# Patient Record
Sex: Female | Born: 1980
Health system: Southern US, Community
[De-identification: ages and names within clinical notes are randomized; demographics above are authoritative.]

## PROBLEM LIST (undated history)

## (undated) DIAGNOSIS — D48119 Desmoid tumor of unspecified site: Secondary | ICD-10-CM

## (undated) DIAGNOSIS — D689 Coagulation defect, unspecified: Secondary | ICD-10-CM

## (undated) DIAGNOSIS — D6851 Activated protein C resistance: Secondary | ICD-10-CM

## (undated) DIAGNOSIS — D481 Neoplasm of uncertain behavior of connective and other soft tissue: Secondary | ICD-10-CM

## (undated) HISTORY — DX: Coagulation defect, unspecified: D68.9

## (undated) HISTORY — PX: OTHER SURGICAL HISTORY: SHX169

---

## 2001-12-08 ENCOUNTER — Other Ambulatory Visit: Admission: RE | Admit: 2001-12-08 | Discharge: 2001-12-08 | Payer: Self-pay | Admitting: Obstetrics and Gynecology

## 2004-06-25 ENCOUNTER — Other Ambulatory Visit: Admission: RE | Admit: 2004-06-25 | Discharge: 2004-06-25 | Payer: Self-pay | Admitting: Obstetrics and Gynecology

## 2005-09-24 ENCOUNTER — Other Ambulatory Visit: Admission: RE | Admit: 2005-09-24 | Discharge: 2005-09-24 | Payer: Self-pay | Admitting: Obstetrics and Gynecology

## 2006-11-24 ENCOUNTER — Inpatient Hospital Stay (HOSPITAL_COMMUNITY): Admission: AD | Admit: 2006-11-24 | Discharge: 2006-11-28 | Payer: Self-pay | Admitting: Obstetrics and Gynecology

## 2006-12-18 ENCOUNTER — Ambulatory Visit (HOSPITAL_COMMUNITY): Admission: RE | Admit: 2006-12-18 | Discharge: 2006-12-19 | Payer: Self-pay | Admitting: Obstetrics and Gynecology

## 2007-05-02 ENCOUNTER — Encounter: Admission: RE | Admit: 2007-05-02 | Discharge: 2007-05-02 | Payer: Self-pay | Admitting: Obstetrics and Gynecology

## 2007-05-04 ENCOUNTER — Encounter: Admission: RE | Admit: 2007-05-04 | Discharge: 2007-05-04 | Payer: Self-pay | Admitting: Obstetrics and Gynecology

## 2008-01-16 ENCOUNTER — Encounter: Admission: RE | Admit: 2008-01-16 | Discharge: 2008-01-16 | Payer: Self-pay | Admitting: Obstetrics and Gynecology

## 2009-10-27 ENCOUNTER — Emergency Department (HOSPITAL_COMMUNITY): Admission: EM | Admit: 2009-10-27 | Discharge: 2009-10-27 | Payer: Self-pay | Admitting: Emergency Medicine

## 2010-09-30 NOTE — H&P (Signed)
NAME:  [name] [name], [name] [name]                   ACCOUNT NO.:  [id number]   MEDICAL RECORD NO.:  [id number]          PATIENT TYPE:  AMB   LOCATION:  SDC                           FACILITY:  [facility]   PHYSICIAN:  Janine Limbo, M.D.DATE OF BIRTH:  [birth year]   DATE OF ADMISSION:  12/18/2006  DATE OF DISCHARGE:                              HISTORY & PHYSICAL   HISTORY OF PRESENT ILLNESS:  The patient is a 30 year old female, now  para 1-0-0-1, who presents for evacuation of a right vulvar hematoma.  The patient had a vaginal delivery on 11/25/2006.  She was noted to  develop a right vulvar hematoma during the pushing process of labor.  After delivery, the hematoma continued to be present, although its size  was thought to be stable.  The patient reports that since she has been  home, the right vulvar area remains very uncomfortable and at this point  she wishes to have evacuation in hopes of making her recovery quicker.   OBSTETRICAL HISTORY:  The patient had a vaginal delivery of a term female  infant on 11/25/2006.   PAST MEDICAL HISTORY:  The patient denies hypertension and diabetes.  She did have her wisdom teeth removed in 2004 and in 2005.   DRUG ALLERGIES:  THE PATIENT REPORTS THAT MORPHINE CAUSES HER TO ITCH,  BUT SHE WAS TOLD THAT THIS WAS NOT A TRUE DRUG ALLERGY.  SHE DENIES  ALLERGIES TO LATEX AND TO BETADINE.   REVIEW OF SYSTEMS:  Please see history of present illness.   FAMILY HISTORY:  Family history is noncontributory.   SOCIAL HISTORY:  The patient is married and she works as a Nurse, mental health.  She denies cigarette use, alcohol use, and recreational drug  use.   PHYSICAL EXAMINATION:  VITAL SIGNS:  Height is 5 feet 2 inches, weight  is 166 pounds.  HEENT:  Within normal limits.  CHEST:  The chest is clear.  HEART:  Regular rate and rhythm.  BREASTS:  Her breasts are without masses.  ABDOMEN:  Her abdomen is nontender.  EXTREMITIES:  The extremities are grossly normal.  NEUROLOGIC:  Examination is grossly normal.  PELVIC EXAMINATION:  External genitalia, there is a large, mature  hematoma of the right labia.  The vagina is within normal limits.  The  cervix is within normal limits.  The uterus is upper limits of normal  size.  No adnexal masses can be appreciated.   ASSESSMENT:  Right vulvar hematoma.   PLAN:  The patient will have evacuation of her right vulvar hematoma.  We talked about the options for management, which include observation  and evacuation.  She elects to proceed with evacuation at this time.      Janine Limbo, M.D.  Electronically Signed     AVS/MEDQ  D:  12/16/2006  T:  12/16/2006  Job:  [id number]

## 2010-09-30 NOTE — Op Note (Signed)
NAME:  [name], [name]                   ACCOUNT NO.:  [id number]   MEDICAL RECORD NO.:  [id number]          PATIENT TYPE:  AMB   LOCATION:  SDC                           [facility]   PHYSICIAN:  Janine Limbo, M.D.DATE OF BIRTH:  11-17-1980   DATE OF PROCEDURE:  12/18/2006  DATE OF DISCHARGE:                               OPERATIVE REPORT   PREOPERATIVE DIAGNOSIS:  Right vulvar hematoma.   POSTOPERATIVE DIAGNOSIS:  Right vulvar hematoma.   PROCEDURE:  Evacuation of right vulvar hematoma.   SURGEON:  Janine Limbo, M.D.   FIRST ASSISTANT:  None.   ANESTHESIA:  General.   DISPOSITION:  [name] [name] is a 30 year old female, para 1-0-0-1, who had a  vaginal delivery three weeks ago.  She had a right vulvar hematoma to  develop during the pushing stage of her labor.  The area continues to be  very uncomfortable at this time.  There is no evidence of infection.  The discomfort has progressed to the point that it is difficult for her  to perform her normal daily functions.  The patient understands the  indications for her surgical procedure and she accepts the risks of, but  not limited to, anesthetic complications, bleeding, infection, and  possible damage to surrounding organs.   FINDINGS:  The patient was noted to have a 12 x 14 cm right vulvar  hematoma.  There was no evidence of infection.  Old blood was drained  from the cavity.  Again, there was no evidence of purulence.  The vagina  appeared normal, otherwise.  The lacerations from her delivery were well  healed.   PROCEDURE IN DETAIL:  The patient was taken to the operating room where  a general anesthetic was given.  The patient's lower abdomen, perineum,  and vagina were prepped with multiple layers of Betadine.  The bladder  was drained of urine.  Examination under anesthesia was performed.  The  patient was sterilely draped.  The inner portion of the right labia was  then injected with 10 mL of 0.5% Marcaine  with epinephrine.  An incision  was made on the medial aspect of the right labia minor and 80 mL of old  blood was drained from the cavity without difficulty.  There was minimal  bleeding from the incision.  The incision was stitched open using a  running suture of 0 Vicryl.  The cavity was then vigorously irrigated.  Hemostasis was noted be adequate throughout.  We felt that we were ready  to end our procedure.  The patient was awakened from her anesthetic  without difficulty and taken to the recovery room in stable condition.  The patient tolerated her procedure well.  The estimated blood loss for  the procedure was 2 mL of new blood, although old blood was drained from  the hematoma cavity.  The patient was given a gram of Ancef  preoperatively.   FOLLOW UP INSTRUCTIONS:  The patient will take ibuprofen 600 mg every 6  hours as needed for mild to moderate pain.  She will take Percocet  5/325, 1-2 tablets every 4-6 hours as needed for severe pain.  She will  sit in a hot tub of water at least once each day until the area is  completely healed.  She will return to see Dr. Stefano [name] in 2-3 weeks for  follow up examination.  She will call for questions or concerns.  She  was given a prescription for Keflex  and she will take 500 mg three times each day for 7 days.  She was given  a copy of the postoperative instruction sheet as prepared by the [facility] for patients who have undergone a dilatation and  curettage that was modified for evacuation of a hematoma.      Janine Limbo, M.D.  Electronically Signed     AVS/MEDQ  D:  12/18/2006  T:  12/18/2006  Job:  [id number]

## 2010-09-30 NOTE — Op Note (Signed)
NAME:  [name], [name]                   ACCOUNT NO.:  [id number]   MEDICAL RECORD NO.:  [id number]          PATIENT TYPE:  AMB   LOCATION:  SDC                           [facility]   PHYSICIAN:  Janine Limbo, M.D.DATE OF BIRTH:  [birth year]   DATE OF PROCEDURE:  12/18/2006  DATE OF DISCHARGE:                               OPERATIVE REPORT   PREOPERATIVE DIAGNOSIS:  Recurrent right vulvar hematoma.   POSTOPERATIVE DIAGNOSIS:  Recurrent right vulvar hematoma.   PROCEDURE:  Re-exploration of right vulvar hematoma with packing.   SURGEON:  Janine Limbo, M.D.   FIRST ASSISTANT:  None.   ANESTHESIA:  Spinal.   DISPOSITION:  [name] [name] is a 30 year old female, para 1-0-0-1, who had a  vaginal delivery on November 25, 2006.  She developed a right vulvar  hematoma prior to delivery of the infant.  The area continued to be very  uncomfortable and she decided to have the area evacuated.  The hematoma  was evacuated this morning and the patient did very well.  Old blood was  noted.  There was no active bleeding.  The hematoma was irrigated and  the patient was taken to the recovery room after the opening of the  hematoma was stitched open.  The patient did well in the recovery room.  She was allowed to eat and was allowed to dress.  Her IV was removed.  The patient went to the restroom prior to discharge and began having  bleeding and then very quickly reaccumulated the right vulvar hematoma.  We were able to evacuate the hematoma in the recovery room, but because  of our concern for how rapidly this occurred, the decision was made to  return to the operating room for re-exploration.  The patient understood  the indications for the re-exploration and she accepted the risks of,  but not limited to, anesthetic complications, bleeding, infection, and  possible damage to surrounding organs.   FINDINGS:  Once we were in the operating room for this second  exploration, the patient was not  noted to have any active bleeding.  The  hematoma had not re-accumulated.  We did explore the area, however, and  with exploration there was bleeding that occurred from the bed of the  old hematoma.  There was no evidence of infection.   PROCEDURE IN DETAIL:  The patient was taken to the operating room for  the second time and this time a spinal anesthetic was given because the  patient had eaten.  The patient's lower abdomen, perineum, and vagina  were prepped with multiple layers of Betadine.  The bladder was drained  of urine.  Examination under anesthesia was performed.  The patient was  sterilely draped.  The incision at the right labia was then extended  approximately 1-2 cm and we explored the bed of the old hematoma.  At  this point, she did have some bleeding and hemostasis was achieved using  the bipolar cautery and 0 Vicryl.  There was no evidence of any apparent  vessels and there was no active pumping  blood vessels.  The hematoma bed  was then packed using 1/2 inch and 1 inch gauze.  The patient was  observed and hemostasis was adequate.  The hematoma bed was injected  with 10 mL of 0.5% Marcaine with epinephrine.  The patient was then  taken to the recovery room in stable condition.  The estimated blood  loss was 20 mL for this portion of the procedure.  She tolerated her  procedure well.   FOLLOW UP INSTRUCTIONS:  The the patient will be observed overnight in  the hospital.  We will obtain a DIC panel (recommendation of Dr. Donnie Coffin  from hematology).  We will plan to remove the packing either on December 19, 2006, or leave the packing in place for a couple of days.      Janine Limbo, M.D.  Electronically Signed     AVS/MEDQ  D:  12/18/2006  T:  12/18/2006  Job:  [id number]

## 2010-09-30 NOTE — Discharge Summary (Signed)
NAME:  [name] [name], [name] [name]                   ACCOUNT NO.:  [id number]   MEDICAL RECORD NO.:  [id number]          PATIENT TYPE:  INP   LOCATION:  9317                          [facility]ILITY:  [facility]   PHYSICIAN:  Janine Limbo, M.D.DATE OF BIRTH:  1980/07/07   DATE OF ADMISSION:  12/18/2006  DATE OF DISCHARGE:  12/19/2006                               DISCHARGE SUMMARY   ADMISSION DIAGNOSIS:  Recurrent right vulvar hematoma.   DISCHARGE DIAGNOSIS:  Recurrent right vulvar hematoma.   PROCEDURE:  This admission:  December 18, 2006.  Evacuation of vulvar  hematoma and then December 18, 2006, re-exploration of vulvar hematoma with  packing.   HISTORY OF PRESENT ILLNESS:  [name] [name] is a 30 year old female who had a  vaginal delivery on November 25, 2006.  She developed a right vulvar  hematoma, while she was pushing during her labor.  The hematoma  persisted and became very uncomfortable.  The patient decided to proceed  with evacuation of the hematoma.  Please see dictated history and  physical exam, for details.   ADMISSION PHYSICAL EXAM:  The patient was noted to have a large, tender  but matured hematoma of the right vulva.  There is no evidence of  induration and no evidence of infection.   HOSPITAL COURSE:  On December 18, 2006, the patient was taken to the  operating room, where the right vulvar hematoma was evacuated.  Old  blood was noted.  The area was irrigated and was noted to be hemostatic.  The patient was sent to the recovery room in stable condition.  In the  recovery room, she recovered well.  She was allowed to eat and was then  allowed to get dressed.  She went to the bathroom to void prior to  discharge and began having bleeding from the right vulvar space.  The  hematoma re-accumulated.  The clot was then evacuated in the recovery  room.  The patient was taken back to the operating room for exploration  of the space.  With re-exploration, no distinct bleeding vessels were  noted.  There  was some generalized oozing from the hematoma cavity.  Hemostasis was achieved using bipolar cautery and figure-of-eight  sutures.  The inner cavity was noted to be friable, however.  The cavity  was packed with gauze.  The patient was taken to the recovery room in  stable condition.  She was given the option of discharge on December 18, 2006 for observation overnight with adequate pain management.  The  patient elected to be observed overnight.  On December 19, 2006, the  patient was able to void, and she tolerated a regular diet.  She was  able to ambulate with the packing in place, without severe discomfort.  She remained afebrile throughout her stay.  Bleeding was minimal.  The  patient's post operative hemoglobin was 13.  Her postoperative white  blood cell count was 7,300.  Her DIC panel was negative.  We felt the  patient was ready for discharge.   DISCHARGE INSTRUCTIONS:  The patient will return to see  Dr. Stefano Gaul in  2 to 3 days.  She will call for heavy bleeding or for problems.  She was  given a prescription for ibuprofen, and she will take 600 mg every 6  hours as needed for mild to moderate pain.  She will take Vicodin 1 to 2  tablets every 4 hours, as needed for severe pain.  She  will take Keflex 500 mg three times each day for 7 days.  She was given  a copy of the postoperative instruction sheet, as prepared by the  [facility] for patients who have undergone a  dilatation and curettage, but then was modified for hematoma evacuation.      Janine Limbo, M.D.  Electronically Signed     AVS/MEDQ  D:  12/19/2006  T:  12/19/2006  Job:  [id number]

## 2010-09-30 NOTE — H&P (Signed)
NAME:  Pann, Rubie                   ACCOUNT NO.:  [id number]   MEDICAL RECORD NO.:  [id number]          PATIENT TYPE:  MAT   LOCATION:  MATC                          [facility]ILITY:  [facility]   PHYSICIAN:  Naima A. Dillard, M.D. DATE OF BIRTH:  Feb 27, 1981   DATE OF ADMISSION:  11/24/2006  DATE OF DISCHARGE:                              HISTORY & PHYSICAL   Ms. Fagin is a 30 year old, married, white female, primigravida at 38-  1/7th's weeks who presents with contractions every 3-8 minutes apart  tonight.  She denies leaking or bleeding, reports positive fetal  movement, no signs or symptoms of PIH.   Her pregnancy has been followed by the [facility] M.D.  service and has been remarkable for:  1. Possible first trimester x-ray exposure.  2. Group B strep negative.   PRENATAL LABORATORY:  Collected on April 28, 2006:  Hemoglobin 13.4,  hematocrit 37.6, platelets 176,000.  Blood type O positive.  Antibody  negative.  RPR nonreactive.  Rubella immune.  Hepatitis B surface  antigen negative.  HIV nonreactive.  Gonorrhea negative.  Chlamydia  negative.  Pap smear from May 2007, was within normal limits.  One-hour  Glucola from August 18, 2006, was 111.  RPR at that time was nonreactive.  Hemoglobin at that time was 11.9.  Culture of the vaginal tract for  Group B strep on October 28, 2006, was negative.   HISTORY OF PRESENT PREGNANCY:  She presented for care at [facility] on April 28, 2006 at 10-2/7th's weeks gestation.  She had an  ultrasound at 8 weeks' gestation that showed placenta previa.  Ultrasonography at 14-1/2 weeks' gestation showed marginal previa.  The  patient declined quad screen.  Ultrasonography at 17 weeks for anatomy  showed previa had resolved, growth was consistent with previous dating  confirming [facility] of November 23, 2006.  The rest of her prenatal care has been  unremarkable.   OBSTETRICAL HISTORY:  She is a primigravida.   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   She experienced menarche at the age of 65 with 71 to 29-day cycles  lasting 5 days.  She has used Yasmin in the past and stopped in June 2007.   She reports having had the usual childhood illnesses.  She has an  occasional UTI.  Reports kidney infection in kindergarten.   SURGICAL HISTORY:  1. Wisdom teeth extraction.  2. Dermoid tumor removed x2 in 2004 and in 2005.   FAMILY MEDICAL HISTORY:  Maternal grandmother with anemia.  Otherwise  unremarkable.   GENETIC HISTORY:  Negative.   SOCIAL HISTORY:  The patient is married to the father of the baby.  His  name is Designer, multimedia.  He is involved and supportive.  They are of the [place]  faith.  The patient has 17 years of eduction and is employed full time  as a Runner, broadcasting/film/video.  Father of the baby has 19 years of education and is  employed full time as an Acupuncturist.  They deny any alcohol,  tobacco,  or illicit drug use with the pregnancy, although the patient  did possibly have an x-ray at the dentist in the 1st trimester.   OBJECTIVE DATA:  VITAL SIGNS:  Stable.  She is afebrile.  HEENT:  Grossly within normal limits.  CHEST:  Clear to auscultation.  HEART:  Regular rate and rhythm.  ABDOMEN:  Gravid in contour with fundal height extending approximately  39-cm above the pubic symphysis.  Fetal heart rate is reactive and  reassuring.  Contractions are every 3-5 minutes.  PELVIC:  The cervix has changed from 2-cm, 50% vertex, -2 to 3-cm, 70%  vertex, and -2 with intact membranes, small amount of bloody show.  EXTREMITIES:  Normal.   ASSESSMENT:  1. Intrauterine pregnancy at term.  2. Early labor.   PLAN:  1. Admit to birthing suite.  2. Routine M.D. orders.  3. Plan expectant management for now.      Cam Hai, C.N.M.      Naima A. Normand Sloop, M.D.  Electronically Signed    KS/MEDQ  D:  11/24/2006  T:  11/25/2006  Job:  [id number]

## 2011-03-02 LAB — DIC (DISSEMINATED INTRAVASCULAR COAGULATION)PANEL
D-Dimer, Quant: 0.56 — ABNORMAL HIGH
Prothrombin Time: 14.1
Smear Review: NONE SEEN
aPTT: 29

## 2011-03-02 LAB — CBC
HCT: 37.1
Platelets: 200
Platelets: 224
RBC: 4
RDW: 11.7
WBC: 6.7
WBC: 7.3

## 2011-03-03 LAB — LACTATE DEHYDROGENASE: LDH: 160

## 2011-03-03 LAB — CBC
HCT: 33.3 — ABNORMAL LOW
HCT: 38.9
HCT: 44.2
Hemoglobin: 12.6
Hemoglobin: 14.9
MCHC: 34.1
MCHC: 34.2
MCHC: 34.2
MCV: 95.2
MCV: 96.4
Platelets: 112 — ABNORMAL LOW
RBC: 3.84 — ABNORMAL LOW
RBC: 4.64
RDW: 12.6
RDW: 12.7
RDW: 12.8
WBC: 10.9 — ABNORMAL HIGH
WBC: 16.2 — ABNORMAL HIGH

## 2011-03-03 LAB — COMPREHENSIVE METABOLIC PANEL
AST: 26
Alkaline Phosphatase: 186 — ABNORMAL HIGH
BUN: 7
Calcium: 9.6
Chloride: 106
Glucose, Bld: 104 — ABNORMAL HIGH
Potassium: 3.9
Total Bilirubin: 1.1

## 2011-03-03 LAB — RPR: RPR Ser Ql: NONREACTIVE

## 2013-01-17 ENCOUNTER — Ambulatory Visit (INDEPENDENT_AMBULATORY_CARE_PROVIDER_SITE_OTHER): Payer: BC Managed Care – PPO | Admitting: Physician Assistant

## 2013-01-17 VITALS — BP 110/74 | HR 95 | Temp 99.4°F | Resp 18 | Ht 66.0 in | Wt 135.2 lb

## 2013-01-17 DIAGNOSIS — R509 Fever, unspecified: Secondary | ICD-10-CM

## 2013-01-17 DIAGNOSIS — J029 Acute pharyngitis, unspecified: Secondary | ICD-10-CM

## 2013-01-17 LAB — POCT CBC
Granulocyte percent: 84.8 %G — AB (ref 37–80)
HCT, POC: 44.3 % (ref 37.7–47.9)
Hemoglobin: 14.7 g/dL (ref 12.2–16.2)
Lymph, poc: 0.8 (ref 0.6–3.4)
MCH, POC: 31.6 pg — AB (ref 27–31.2)
MCHC: 33.2 g/dL (ref 31.8–35.4)
MCV: 95.2 fL (ref 80–97)
MID (cbc): 0.4 (ref 0–0.9)
MPV: 11.1 fL (ref 0–99.8)
POC Granulocyte: 6.7 (ref 2–6.9)
POC LYMPH PERCENT: 10.1 %L (ref 10–50)
POC MID %: 5.1 %M (ref 0–12)
Platelet Count, POC: 110 10*3/uL — AB (ref 142–424)
RBC: 4.65 M/uL (ref 4.04–5.48)
RDW, POC: 12.3 %
WBC: 7.9 10*3/uL (ref 4.6–10.2)

## 2013-01-17 LAB — POCT RAPID STREP A (OFFICE): Rapid Strep A Screen: NEGATIVE

## 2013-01-17 MED ORDER — AMOXICILLIN 875 MG PO TABS: 875.0000 mg | ORAL_TABLET | Freq: Two times a day (BID) | ORAL | Status: DC

## 2013-01-17 NOTE — Progress Notes (Signed)
  Subjective:    Patient ID: [name] [name], female    DOB: Mar 06, 1981, 32 y.o.   MRN: [id number]  HPI  32 year old female presents with acute onset of fever, chills, right ear pain, and sore throat.  States symptoms started suddenly yesterday and have progressively worsened. Does have a history of both ear infections and strep with last episode about 1 year ago.  No known strep contacts.  Has had fever of 101 at home. Took ibuprofen this morning at 5:00 a.m which has helped.  Denies nasal congestion, cough, sinus pain, headache, nausea, or vomiting.  Admits to painful swallowing but is able to do so.  She has 2 children ages 74 and 51  - neither are sick at this time.  She is a stay at home mom.  Patient is otherwise doing well with no other concerns today.      Review of Systems  Constitutional: Positive for fever and chills.  HENT: Positive for ear pain (right side) and sore throat. Negative for congestion, rhinorrhea, trouble swallowing and postnasal drip.   Respiratory: Negative for cough, shortness of breath and wheezing.   Gastrointestinal: Negative for nausea, vomiting and abdominal pain.  Neurological: Negative for dizziness and headaches.       Objective:   Physical Exam  Constitutional: She is oriented to person, place, and time. She appears well-developed and well-nourished.  HENT:  Head: Normocephalic and atraumatic.  Right Ear: Hearing, tympanic membrane, external ear and ear canal normal.  Left Ear: Hearing, tympanic membrane, external ear and ear canal normal.  Mouth/Throat: Uvula is midline and mucous membranes are normal. Posterior oropharyngeal erythema present. No oropharyngeal exudate, posterior oropharyngeal edema or tonsillar abscesses.  Eyes: Conjunctivae are normal.  Neck: Normal range of motion. Neck supple.  Cardiovascular: Normal rate, regular rhythm and normal heart sounds.   Pulmonary/Chest: Effort normal and breath sounds normal.  Lymphadenopathy:    She has no  cervical adenopathy.  Neurological: She is alert and oriented to person, place, and time.  Psychiatric: She has a normal mood and affect. Her behavior is normal. Thought content normal.          Assessment & Plan:  Acute pharyngitis - Plan: POCT rapid strep A, POCT CBC, Culture, Group A Strep, amoxicillin (AMOXIL) 875 MG tablet  Start amoxicillin 875 mg bid x 10 days Increase fluids and rest Continue ibuprofen tid prn fever and pain Throat culture pending Follow up if symptoms worsen or fail to improve.

## 2013-01-20 LAB — CULTURE, GROUP A STREP: Organism ID, Bacteria: NORMAL

## 2013-01-23 ENCOUNTER — Telehealth: Payer: Self-pay

## 2013-01-23 NOTE — Telephone Encounter (Signed)
PT STATES SHE TRIED GOING INTO MYCHART AND IT WASN'T ANYTHING THERE. WOULD LIKE TO KNOW TEST RESULTS PLEASE CALL [phone]

## 2013-01-23 NOTE — Telephone Encounter (Signed)
Released results to mychart, lab has left message for her to call back.

## 2013-03-30 ENCOUNTER — Ambulatory Visit (INDEPENDENT_AMBULATORY_CARE_PROVIDER_SITE_OTHER): Payer: BC Managed Care – PPO | Admitting: Physician Assistant

## 2013-03-30 VITALS — BP 94/60 | HR 94 | Temp 99.1°F | Resp 16 | Ht 63.0 in | Wt 140.0 lb

## 2013-03-30 DIAGNOSIS — R509 Fever, unspecified: Secondary | ICD-10-CM

## 2013-03-30 DIAGNOSIS — J019 Acute sinusitis, unspecified: Secondary | ICD-10-CM

## 2013-03-30 DIAGNOSIS — R05 Cough: Secondary | ICD-10-CM

## 2013-03-30 LAB — POCT INFLUENZA A/B
Influenza A, POC: NEGATIVE
Influenza B, POC: NEGATIVE

## 2013-03-30 MED ORDER — HYDROCODONE-HOMATROPINE 5-1.5 MG/5ML PO SYRP: ORAL_SOLUTION | ORAL | Status: DC

## 2013-03-30 MED ORDER — AMOXICILLIN 875 MG PO TABS: 875.0000 mg | ORAL_TABLET | Freq: Two times a day (BID) | ORAL | Status: DC

## 2013-03-30 MED ORDER — IPRATROPIUM BROMIDE 0.06 % NA SOLN: 2.0000 | Freq: Three times a day (TID) | NASAL | Status: DC

## 2013-03-30 NOTE — Progress Notes (Signed)
Patient ID: [name] [name] MRN: [id number], DOB: [birth year], 32 y.o. Date of Encounter: 03/30/2013, 8:28 PM  Primary Physician: No primary provider on file.  Chief Complaint: URI x 5 days  HPI: 32 y.o. female with history below presents with nasal congestion, rhinorrhea, post nasal drip, and cough x 5 days. Her mother was diagnosed with confirmed influenza A here at this office today. She has spent some time with her and she would like to be tested. Subjective fever and chills. Cough is productive of sputum and not associated with time of day. No SOB or wheezing. Symptoms began with a ST that is now resolved. Some sinus pressure. She feels like there is a lot of pressure in her head. Ears feel full. No GI complaints. Normal appetite. Has been taking ibuprofen. Last dose of 400 mg was around 8 AM today.    Past Medical History  Diagnosis Date  . Clotting disorder      Home Meds: Prior to Admission medications   Not on File    Allergies:  Allergies  Allergen Reactions  . Morphine And Related Itching    History   Social History  . Marital Status: Married    Spouse Name: N/A    Number of Children: N/A  . Years of Education: N/A   Occupational History  . Not on file.   Social History Main Topics  . Smoking status: Never Smoker   . Smokeless tobacco: Not on file  . Alcohol Use: No  . Drug Use: No  . Sexual Activity: Not on file   Other Topics Concern  . Not on file   Social History Narrative  . No narrative on file     Review of Systems: Constitutional: positive for chills, fever, and fatigue  HEENT: see above Cardiovascular: negative for chest pain or palpitations Respiratory: positive for cough. negative for wheezing, or shortness of breath Abdominal: negative for abdominal pain, nausea, vomiting, or diarrhea Dermatological: negative for rash Neurologic: positive for headache. negative for dizziness, or syncope   Physical Exam: Blood pressure 94/60, pulse 94,  temperature 99.1 F (37.3 C), resp. rate 16, height 5\' 3"  (1.6 m), weight 140 lb (63.504 kg), last menstrual period 03/16/2013, SpO2 99.00%., Body mass index is 24.81 kg/(m^2). General: Well developed, well nourished, in no acute distress. Head: Normocephalic, atraumatic, eyes without discharge, sclera non-icteric, nares are congested. Bilateral auditory canals clear, TM's are without perforation, pearly grey and translucent with reflective cone of light bilaterally. Oral cavity moist, posterior pharynx erythematous with post nasal drip. No exudate or peritonsillar abscess. Uvula midline. Neck: Supple. No thyromegaly. Full ROM. No lymphadenopathy. Lungs: Clear bilaterally to auscultation without wheezes, rales, or rhonchi. Breathing is unlabored. Heart: RRR with S1 S2. No murmurs, rubs, or gallops appreciated. Msk:  Strength and tone normal for age. Extremities/Skin: Warm and dry. No clubbing or cyanosis. No edema. No rashes or suspicious lesions. Neuro: Alert and oriented X 3. Moves all extremities spontaneously. Gait is normal. CNII-XII grossly in tact. Psych:  Responds to questions appropriately with a normal affect.   Labs: Results for orders placed in visit on 03/30/13  POCT INFLUENZA A/B      Result Value Range   Influenza A, POC Negative     Influenza B, POC Negative       ASSESSMENT AND PLAN:  32 y.o. female with sinusitis, cough, and fever -Amoxicillin 875 mg 1 po bid #20 no RF -Atrovent NS 0.06% 2 sprays each nare bid prn #1 no  RF -Hycodan #4oz 1 tsp po q 4-6 hours prn cough no RF SED, discussed patient's allergy and advised her to take only 1/2 a tsp. If she develops pruritis she can call for Tessalon. States she tolerates cough syrups  -Rest/fluids -RTC precautions -RTC 3-5 days if no better, sooner if worse  Signed, Eula Listen, PA-C [facility] [place], [place] [address] [phone] 03/30/2013 8:28 PM

## 2014-08-13 ENCOUNTER — Ambulatory Visit (INDEPENDENT_AMBULATORY_CARE_PROVIDER_SITE_OTHER): Payer: BLUE CROSS/BLUE SHIELD | Admitting: Family Medicine

## 2014-08-13 VITALS — BP 100/60 | HR 84 | Temp 98.1°F | Resp 16 | Ht 64.0 in | Wt 136.8 lb

## 2014-08-13 DIAGNOSIS — R070 Pain in throat: Secondary | ICD-10-CM | POA: Diagnosis not present

## 2014-08-13 DIAGNOSIS — J029 Acute pharyngitis, unspecified: Secondary | ICD-10-CM | POA: Diagnosis not present

## 2014-08-13 LAB — POCT INFLUENZA A/B
Influenza A, POC: NEGATIVE
Influenza B, POC: NEGATIVE

## 2014-08-13 LAB — POCT RAPID STREP A (OFFICE): RAPID STREP A SCREEN: NEGATIVE

## 2014-08-13 NOTE — Patient Instructions (Signed)

## 2014-08-13 NOTE — Progress Notes (Signed)
Subjective:    Patient ID: [name] [name], female    DOB: 07-03-1980, 34 y.o.   MRN: [id number]  08/13/2014  Sore Throat and Generalized Body Aches   HPI This 34 y.o. female presents for evaluation of sore throat, body aches.    +fever Tmax 99.5.  +achy; +HA.  +ST all over; pain with swallowing.  Feels swollen.  No painwith talking.  No rhinorrhea; +nasal congestion.  No cough.  +B mild pain.  No n/v/d.  No rash.  Son sick with fever, diarrhea.  Took Nyquil and Oscillococcinum. No flu vaccine. Homemaker.  PCP: Laurann Montana.   Review of Systems  Constitutional: Positive for chills and diaphoresis. Negative for fever and fatigue.  HENT: Positive for congestion, ear pain, sore throat and trouble swallowing. Negative for postnasal drip, rhinorrhea, sinus pressure and voice change.   Respiratory: Negative for cough and shortness of breath.   Cardiovascular: Negative for chest pain, palpitations and leg swelling.  Gastrointestinal: Negative for nausea, vomiting, abdominal pain, diarrhea and constipation.  Skin: Negative for rash.  Neurological: Positive for headaches. Negative for dizziness.    Past Medical History  Diagnosis Date  . Clotting disorder    Past Surgical History  Procedure Laterality Date  . 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 Morphine And Related Itching   Current Outpatient Prescriptions  Medication Sig Dispense Refill  . amoxicillin (AMOXIL) 875 MG tablet Take 1 tablet (875 mg total) by mouth 2 (two) times daily. (Patient not taking: Reported on 08/13/2014) 20 tablet 0  . HYDROcodone-homatropine (HYCODAN) 5-1.5 MG/5ML syrup 1 TSP PO Q 4-6 HOURS PRN COUGH (Patient not taking: Reported on 08/13/2014) 120 mL 0  . ipratropium (ATROVENT) 0.06 % nasal spray Place 2 sprays into the nose 3 (three) times daily. (Patient not taking: Reported on 08/13/2014) 15 mL 0   No current facility-administered medications for this visit.         Objective:    BP 100/60 mmHg  Pulse 84  Temp(Src) 98.1 F (36.7 C) (Oral)  Resp 16  Ht 5\' 4"  (1.626 m)  Wt 136 lb 12.8 oz (62.052 kg)  BMI 23.47 kg/m2  SpO2 98% Physical Exam  Constitutional: She is oriented to person, place, and time. She appears well-developed and well-nourished. No distress.  HENT:  Head: Normocephalic and atraumatic.  Right Ear: Tympanic membrane, external ear and ear canal normal.  Left Ear: Tympanic membrane, external ear and ear canal normal.  Nose: Nose normal.  Mouth/Throat: Oropharynx is clear and moist.  Eyes: Conjunctivae are normal. Pupils are equal, round, and reactive to light.  Neck: Normal range of motion. Neck supple. No thyromegaly present.  Cardiovascular: Normal rate, regular rhythm and normal heart sounds.  Exam reveals no gallop and no friction rub.   No murmur heard. Pulmonary/Chest: Effort normal and breath sounds normal. She has no wheezes. She has no rales.  Lymphadenopathy:    She has no cervical adenopathy.  Neurological: She is alert and oriented to person, place, and time.  Skin: No rash noted. She is not diaphoretic.  Psychiatric: She has a normal mood and affect. Her behavior is normal.  Nursing note and vitals reviewed.  Results for orders placed or performed in visit on 08/13/14  POCT rapid strep A  Result Value Ref Range   Rapid Strep A Screen Negative Negative  POCT Influenza A/B  Result Value Ref Range   Influenza A, POC Negative    Influenza  B, POC Negative        Assessment & Plan:   1. Pain in throat    -New. -Supportive care with rest, fluids, Ibuprofen or Tylenol.  -RTC inability to swallow.   No orders of the defined types were placed in this encounter.    No Follow-up on file.    Deniz Hannan Elayne Guerin, M.D. [facility] [address] [place], [place]  [phone] phone [phone] fax

## 2014-08-14 LAB — CULTURE, GROUP A STREP: ORGANISM ID, BACTERIA: NORMAL

## 2015-07-19 ENCOUNTER — Ambulatory Visit
Admission: RE | Admit: 2015-07-19 | Discharge: 2015-07-19 | Disposition: A | Discharge status: Home / Self Care | Payer: Commercial Managed Care - HMO | Source: Ambulatory Visit | Attending: Physician Assistant | Admitting: Physician Assistant

## 2015-07-19 ENCOUNTER — Other Ambulatory Visit: Payer: Self-pay | Admitting: Physician Assistant

## 2015-07-19 DIAGNOSIS — R52 Pain, unspecified: Secondary | ICD-10-CM

## 2016-02-10 ENCOUNTER — Other Ambulatory Visit: Payer: Self-pay | Admitting: Family Medicine

## 2016-02-10 ENCOUNTER — Other Ambulatory Visit (HOSPITAL_COMMUNITY)
Admission: RE | Admit: 2016-02-10 | Discharge: 2016-02-10 | Disposition: A | Discharge status: Home / Self Care | Payer: Commercial Managed Care - HMO | Source: Ambulatory Visit | Attending: Family Medicine | Admitting: Family Medicine

## 2016-02-10 DIAGNOSIS — Z124 Encounter for screening for malignant neoplasm of cervix: Secondary | ICD-10-CM | POA: Diagnosis not present

## 2016-02-12 LAB — CYTOLOGY - PAP

## 2016-03-09 ENCOUNTER — Emergency Department (HOSPITAL_BASED_OUTPATIENT_CLINIC_OR_DEPARTMENT_OTHER)
Admission: EM | Admit: 2016-03-09 | Discharge: 2016-03-09 | Disposition: A | Discharge status: Home / Self Care | Payer: Commercial Managed Care - HMO | Attending: Physician Assistant | Admitting: Physician Assistant

## 2016-03-09 ENCOUNTER — Encounter (HOSPITAL_BASED_OUTPATIENT_CLINIC_OR_DEPARTMENT_OTHER): Payer: Self-pay | Admitting: *Deleted

## 2016-03-09 DIAGNOSIS — M5410 Radiculopathy, site unspecified: Secondary | ICD-10-CM | POA: Insufficient documentation

## 2016-03-09 DIAGNOSIS — M79661 Pain in right lower leg: Secondary | ICD-10-CM | POA: Diagnosis present

## 2016-03-09 HISTORY — DX: Activated protein C resistance: D68.51

## 2016-03-09 HISTORY — DX: Neoplasm of uncertain behavior of connective and other soft tissue: D48.1

## 2016-03-09 HISTORY — DX: Desmoid tumor of unspecified site: D48.119

## 2016-03-09 MED ORDER — NAPROXEN 500 MG PO TABS: 500.0000 mg | ORAL_TABLET | Freq: Two times a day (BID) | ORAL | 0 refills | Status: DC

## 2016-03-09 MED ORDER — METHOCARBAMOL 500 MG PO TABS: 1000.0000 mg | ORAL_TABLET | Freq: Four times a day (QID) | ORAL | 0 refills | Status: DC

## 2016-03-09 MED FILL — NAPROXEN 500 MG TABLET: 500 | 10 days supply | Qty: 20 | Fill #0

## 2016-03-09 MED FILL — METHOCARBAMOL 500 MG TABLET: 500 | 3 days supply | Qty: 20 | Fill #0

## 2016-03-09 NOTE — Discharge Instructions (Signed)
Please read and follow all provided instructions.  Your diagnoses today include:  1. Radicular pain of right lower extremity     Tests performed today include:  Vital signs - see below for your results today  Medications prescribed:   Robaxin (methocarbamol) - muscle relaxer medication  DO NOT drive or perform any activities that require you to be awake and alert because this medicine can make you drowsy.    Naproxen - anti-inflammatory pain medication  Do not exceed 500mg  naproxen every 12 hours, take with food  You have been prescribed an anti-inflammatory medication or NSAID. Take with food. Take smallest effective dose for the shortest duration needed for your pain. Stop taking if you experience stomach pain or vomiting.   Take any prescribed medications only as directed.  Home care instructions:   Follow any educational materials contained in this packet  Please rest, use ice or heat on your back for the next several days  Do not lift, push, pull anything more than 10 pounds for the next week  Follow-up instructions: Please follow-up with the sports medicine doctor (orthopedist) next 1 week for further evaluation of your symptoms.   Return instructions:  SEEK IMMEDIATE MEDICAL ATTENTION IF YOU HAVE:  New numbness, tingling, weakness, or problem with the use of your arms or legs  Severe back pain not relieved with medications  Loss control of your bowels or bladder  Increasing pain in any areas of the body (such as chest or abdominal pain)  Shortness of breath, dizziness, or fainting.   Worsening nausea (feeling sick to your stomach), vomiting, fever, or sweats  Any other emergent concerns regarding your health   Additional Information:  Your vital signs today were: BP 109/68 (BP Location: Right Arm)    Pulse 73    Temp 98.2 F (36.8 C) (Oral)    Resp 18    Ht 5\' 2"  (1.575 m)    Wt 61.2 kg    LMP 02/16/2016    SpO2 100%    BMI 24.69 kg/m  If your blood  pressure (BP) was elevated above 135/85 this visit, please have this repeated by your doctor within one month. --------------

## 2016-03-09 NOTE — ED Provider Notes (Signed)
[name] Provider Note   CSN: [id number] Arrival date & time: 03/09/16  1020     History   Chief Complaint Chief Complaint  Patient presents with  . Leg Pain    HPI [name] [name] is a 35 y.o. female.  Patient with history of desmoid tumor removal, intermittent radicular type back pain - presents with complaint of right buttock pain with radiation into her right leg. Symptoms started approximally one week ago but were more mild at that time. She did have some relief at onset with playing tennis. Patient took ibuprofen yesterday without relief after pain began worsening. She describes a shooting type pain that starts in the middle of her buttock and shoots down the back of her right leg past her knee to her calf. Pain is worsened and shoots down her leg in certain positions. No swelling. No acute injury. Patient has had similar pain in the past and had negative plain film x-rays. She has a remote MRI of her lower back which was reportedly negative. Patient denies warning symptoms of back pain including: fecal incontinence, urinary retention or overflow incontinence, night sweats, waking from sleep with back pain, unexplained fevers or weight loss, h/o cancer, IVDU, recent trauma.         Past Medical History:  Diagnosis Date  . Clotting disorder ([place])   . Desmoid tumor   . Factor 5 Leiden mutation, heterozygous ([place])     There are no active problems to display for this patient.   Past Surgical History:  Procedure Laterality Date  . desmoid tumor removal  12/2001 and 04/2002    OB History    No data available       Home Medications    Prior to Admission medications   Medication Sig Start Date End Date Taking? Authorizing Provider  methocarbamol (ROBAXIN) 500 MG tablet Take 2 tablets (1,000 mg total) by mouth 4 (four) times daily. 03/09/16   Carlisle Cater, PA-C  naproxen (NAPROSYN) 500 MG tablet Take 1 tablet (500 mg total) by mouth 2 (two) times daily.  03/09/16   Carlisle Cater, PA-C    Family History No family history on file.  Social History Social History  Substance Use Topics  . Smoking status: Never Smoker  . Smokeless tobacco: Never Used  . Alcohol use No     Allergies   Morphine and related   Review of Systems Review of Systems  Constitutional: Negative for fever and unexpected weight change.  Gastrointestinal: Negative for constipation.       Negative for fecal incontinence.   Genitourinary: Negative for dysuria, flank pain, hematuria, pelvic pain, vaginal bleeding and vaginal discharge.       Negative for urinary incontinence or retention.  Musculoskeletal: Positive for back pain and myalgias.  Neurological: Negative for weakness and numbness.       Denies saddle paresthesias.     Physical Exam Updated Vital Signs BP 95/61   Pulse 65   Temp 98.2 F (36.8 C) (Oral)   Resp 18   Ht 5\' 2"  (1.575 m)   Wt 61.2 kg   LMP 02/16/2016   SpO2 100%   BMI 24.69 kg/m   Physical Exam  Constitutional: She appears well-developed and well-nourished. Distressed:    HENT:  Head: Normocephalic and atraumatic.  Eyes: Conjunctivae are normal.  Neck: Normal range of motion. Neck supple.  Pulmonary/Chest: Effort normal.  Abdominal: Soft. There is no tenderness. There is no CVA tenderness.  Musculoskeletal: Normal range of  motion. She exhibits no tenderness.       Right hip: Normal.       Left hip: Normal.       Cervical back: Normal.       Thoracic back: Normal.       Lumbar back: She exhibits normal range of motion and no bony tenderness.       Back:  No step-off noted with palpation of spine.   Neurological: She is alert. She has normal strength. No sensory deficit.  5/5 strength in entire lower extremities bilaterally. No sensation deficit. No foot drop.  Skin: Skin is warm and dry. No rash noted.  Psychiatric: She has a normal mood and affect.  Nursing note and vitals reviewed.    ED Treatments / Results    Procedures Procedures (including critical care time)  Medications Ordered in ED Medications - No data to display   Initial Impression / Assessment and Plan / ED Course  I have reviewed the triage vital signs and the nursing notes.  Pertinent labs & imaging results that were available during my care of the patient were reviewed by me and considered in my medical decision making (see chart for details).  Clinical Course   Patient seen and examined.   Vital signs reviewed and are as follows: Vitals:   03/09/16 1027 03/09/16 1235  BP: 109/68 95/61  Pulse: 73 65  Resp: 18 18  Temp: 98.2 F (36.8 C)     No red flag s/s of low back pain. Patient was counseled on back pain precautions and told to do activity as tolerated but do not lift, push, or pull heavy objects more than 10 pounds for the next week.  Patient counseled to use ice or heat on back for no longer than 15 minutes every hour.   Patient prescribed muscle relaxer and counseled on proper use of muscle relaxant medication.    Urged patient not to drink alcohol, drive, or perform any other activities that requires focus while taking sedating medications.  Patient urged to follow-up with PCP if pain does not improve with treatment and rest or if pain becomes recurrent. Urged to return with worsening severe pain, loss of bowel or bladder control, trouble walking.   The patient verbalizes understanding and agrees with the plan.    Final Clinical Impressions(s) / ED Diagnoses   Final diagnoses:  Radicular pain of right lower extremity   Patient with back pain with radicular features. No neurological deficits. Patient is ambulatory. No warning symptoms of back pain including: fecal incontinence, urinary retention or overflow incontinence, night sweats, waking from sleep with back pain, unexplained fevers or weight loss, h/o cancer, IVDU, recent trauma. No concern for cauda equina, epidural abscess, or other emergent cause  of back pain. Conservative measures such as rest, ice/heat and pain medicine indicated with PCP follow-up if no improvement with conservative management.    New Prescriptions Discharge Medication List as of 03/09/2016 12:17 PM    START taking these medications   Details  methocarbamol (ROBAXIN) 500 MG tablet Take 2 tablets (1,000 mg total) by mouth 4 (four) times daily., Starting Mon 03/09/2016, Print    naproxen (NAPROSYN) 500 MG tablet Take 1 tablet (500 mg total) by mouth 2 (two) times daily., Starting Mon 03/09/2016, Print         Carlisle Cater, PA-C 03/09/16 [place], MD 03/09/16 1436

## 2016-03-09 NOTE — ED Triage Notes (Signed)
Pt reports pain in right buttock moving into right leg x 1 week. Movement gives some relief

## 2016-03-10 ENCOUNTER — Ambulatory Visit (INDEPENDENT_AMBULATORY_CARE_PROVIDER_SITE_OTHER): Payer: Commercial Managed Care - HMO | Admitting: Family Medicine

## 2016-03-10 ENCOUNTER — Encounter: Payer: Self-pay | Admitting: Family Medicine

## 2016-03-10 DIAGNOSIS — M79604 Pain in right leg: Secondary | ICD-10-CM

## 2016-03-10 NOTE — Patient Instructions (Signed)
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 Try to avoid painful activities when possible. Pick 2-3 stretches where you feel the pull in the area of pain - do 3 of these and hold for 20-30 seconds at least once a day Side hip raises and standing hip rotations 3 sets of 10 once a day. Consider tennis ball to massage area when sitting Ibuprofen 600mg  three times a day with food OR aleve 2 tabs twice a day with food for pain and inflammation as needed though the stretches and strengthening exercises are what will cause this to improve - these are more for pain. Consider physical therapy, steroid dose pack if not improving. Follow up with me in 5-6 weeks for reevaluation.

## 2016-03-12 DIAGNOSIS — G5700 Lesion of sciatic nerve, unspecified lower limb: Secondary | ICD-10-CM | POA: Insufficient documentation

## 2016-03-12 DIAGNOSIS — M79604 Pain in right leg: Principal | ICD-10-CM

## 2016-03-12 NOTE — Assessment & Plan Note (Signed)
2/2 piriformis syndrome with sciatica.  Shown home exercises and stretches to do regularly.  Tennis ball massage, ibuprofen or aleve.  Consider physical therapy, prednisone if not improving.  F/u in 5-6 weeks.

## 2016-03-12 NOTE — Progress Notes (Signed)
PCP: Osborne Casco, MD  Subjective:   HPI: Patient is a 35 y.o. female here for right leg pain.  Patient reports she's had problems about yearly in right leg. She will get pain in right buttock area radiating down this leg to her right calf. Most episodes occur for about a week - improve with rest. Most recent episode was back in March 2017. She rested, took ibuprofen and did well. Current episode started on 10/15 and persists. Pain is 4/10, sharp. Tried robaxin, naproxen. No skin changes, numbness. No bowel/bladder dysfunction.  Past Medical History:  Diagnosis Date  . Clotting disorder ([place])   . Desmoid tumor   . Factor 5 Leiden mutation, heterozygous [facility])     Current Outpatient Prescriptions on File Prior to Visit  Medication Sig Dispense Refill  . methocarbamol (ROBAXIN) 500 MG tablet Take 2 tablets (1,000 mg total) by mouth 4 (four) times daily. 20 tablet 0  . naproxen (NAPROSYN) 500 MG tablet Take 1 tablet (500 mg total) by mouth 2 (two) times daily. 20 tablet 0   No current facility-administered medications on file prior to visit.     Past Surgical History:  Procedure Laterality Date  . desmoid tumor removal  12/2001 and 04/2002    Allergies  Allergen Reactions  . Morphine And Related Itching  . Meperidine Itching    Social History   Social History  . Marital status: Married    Spouse name: N/A  . Number of children: N/A  . Years of education: N/A   Occupational History  . Not on file.   Social History Main Topics  . Smoking status: Never Smoker  . Smokeless tobacco: Never Used  . Alcohol use No  . Drug use: No  . Sexual activity: Not on file   Other Topics Concern  . Not on file   Social History Narrative  . No narrative on file    No family history on file.  BP 101/71   Pulse 85   Ht 5\' 2"  (1.575 m)   Wt 140 lb (63.5 kg)   LMP 02/16/2016   BMI 25.61 kg/m   Review of Systems: See HPI above.    Objective:  Physical  Exam:  Gen: NAD, comfortable in exam room  Back/right leg: No gross deformity, scoliosis. TTP over right piriformis.  No midline or bony TTP. FROM. Strength LEs 5/5 all muscle groups.   2+ MSRs in patellar and achilles tendons, equal bilaterally. Negative SLRs. Sensation intact to light touch bilaterally. Negative logroll bilateral hips Positive right piriformis.  Negative left. Negative fabers.    Assessment & Plan:  1. Right leg pain - 2/2 piriformis syndrome with sciatica.  Shown home exercises and stretches to do regularly.  Tennis ball massage, ibuprofen or aleve.  Consider physical therapy, prednisone if not improving.  F/u in 5-6 weeks.

## 2016-04-15 ENCOUNTER — Ambulatory Visit (INDEPENDENT_AMBULATORY_CARE_PROVIDER_SITE_OTHER): Payer: Commercial Managed Care - HMO | Admitting: Family Medicine

## 2016-04-15 ENCOUNTER — Encounter: Payer: Self-pay | Admitting: Family Medicine

## 2016-04-15 DIAGNOSIS — M79604 Pain in right leg: Secondary | ICD-10-CM | POA: Diagnosis not present

## 2016-04-16 NOTE — Assessment & Plan Note (Signed)
2/2 piriformis syndrome with sciatica.  Much improved.  Discussed home exercises, stretches 2-3 times a week for 6 more weeks.  Tennis ball massage, ibuprofen or aleve as needed.  F/u prn.

## 2016-04-16 NOTE — Progress Notes (Signed)
PCP: Osborne Casco, MD  Subjective:   HPI: Patient is a 35 y.o. female here for right leg pain.  10/24: Patient reports she's had problems about yearly in right leg. She will get pain in right buttock area radiating down this leg to her right calf. Most episodes occur for about a week - improve with rest. Most recent episode was back in March 2017. She rested, took ibuprofen and did well. Current episode started on 10/15 and persists. Pain is 4/10, sharp. Tried robaxin, naproxen. No skin changes, numbness. No bowel/bladder dysfunction.  11/29: Patient reports she feels much better. Has been able to play tennis without a problem. Doing home exercises, stretches. Some achiness if this comes on. Took about 2 weeks for it to improve. No skin changes, numbness. No radiation.  Past Medical History:  Diagnosis Date  . Clotting disorder ([place])   . Desmoid tumor   . Factor 5 Leiden mutation, heterozygous [facility])     Current Outpatient Prescriptions on File Prior to Visit  Medication Sig Dispense Refill  . methocarbamol (ROBAXIN) 500 MG tablet Take 2 tablets (1,000 mg total) by mouth 4 (four) times daily. 20 tablet 0  . naproxen (NAPROSYN) 500 MG tablet Take 1 tablet (500 mg total) by mouth 2 (two) times daily. 20 tablet 0   No current facility-administered medications on file prior to visit.     Past Surgical History:  Procedure Laterality Date  . desmoid tumor removal  12/2001 and 04/2002    Allergies  Allergen Reactions  . Morphine And Related Itching  . Meperidine Itching    Social History   Social History  . Marital status: Married    Spouse name: N/A  . Number of children: N/A  . Years of education: N/A   Occupational History  . Not on file.   Social History Main Topics  . Smoking status: Never Smoker  . Smokeless tobacco: Never Used  . Alcohol use No  . Drug use: No  . Sexual activity: Not on file   Other Topics Concern  . Not on file    Social History Narrative  . No narrative on file    No family history on file.  BP 112/66   Pulse 78   Ht 5\' 2"  (1.575 m)   Wt 140 lb (63.5 kg)   BMI 25.61 kg/m   Review of Systems: See HPI above.    Objective:  Physical Exam:  Gen: NAD, comfortable in exam room  Back/right leg: No gross deformity, scoliosis. No TTP over right piriformis.  No midline or bony TTP. FROM. Strength LEs 5/5 all muscle groups.   Negative SLRs. Sensation intact to light touch bilaterally. Negative logroll bilateral hips Negative right piriformis.  Negative left. Negative fabers.    Assessment & Plan:  1. Right leg pain - 2/2 piriformis syndrome with sciatica.  Much improved.  Discussed home exercises, stretches 2-3 times a week for 6 more weeks.  Tennis ball massage, ibuprofen or aleve as needed.  F/u prn.

## 2016-10-11 DIAGNOSIS — J00 Acute nasopharyngitis [common cold]: Secondary | ICD-10-CM | POA: Diagnosis not present

## 2016-10-22 DIAGNOSIS — D2272 Melanocytic nevi of left lower limb, including hip: Secondary | ICD-10-CM | POA: Diagnosis not present

## 2016-10-22 DIAGNOSIS — D225 Melanocytic nevi of trunk: Secondary | ICD-10-CM | POA: Diagnosis not present

## 2016-10-22 DIAGNOSIS — D2262 Melanocytic nevi of left upper limb, including shoulder: Secondary | ICD-10-CM | POA: Diagnosis not present

## 2017-03-01 DIAGNOSIS — Z13220 Encounter for screening for lipoid disorders: Secondary | ICD-10-CM | POA: Diagnosis not present

## 2017-03-01 DIAGNOSIS — Z0000 Encounter for general adult medical examination without abnormal findings: Secondary | ICD-10-CM | POA: Diagnosis not present

## 2017-08-08 DIAGNOSIS — H66003 Acute suppurative otitis media without spontaneous rupture of ear drum, bilateral: Secondary | ICD-10-CM | POA: Diagnosis not present

## 2017-10-26 DIAGNOSIS — D2261 Melanocytic nevi of right upper limb, including shoulder: Secondary | ICD-10-CM | POA: Diagnosis not present

## 2017-10-26 DIAGNOSIS — D225 Melanocytic nevi of trunk: Secondary | ICD-10-CM | POA: Diagnosis not present

## 2017-10-26 DIAGNOSIS — L814 Other melanin hyperpigmentation: Secondary | ICD-10-CM | POA: Diagnosis not present

## 2018-04-11 DIAGNOSIS — Z0000 Encounter for general adult medical examination without abnormal findings: Secondary | ICD-10-CM | POA: Diagnosis not present

## 2019-02-01 ENCOUNTER — Ambulatory Visit: Payer: Commercial Managed Care - HMO | Admitting: Family Medicine

## 2019-02-08 ENCOUNTER — Ambulatory Visit: Payer: Self-pay | Admitting: Family Medicine

## 2019-02-15 ENCOUNTER — Ambulatory Visit: Payer: Self-pay | Admitting: Family Medicine

## 2019-02-16 ENCOUNTER — Encounter: Payer: Self-pay | Admitting: Family Medicine

## 2019-02-16 ENCOUNTER — Ambulatory Visit (INDEPENDENT_AMBULATORY_CARE_PROVIDER_SITE_OTHER): Payer: 59 | Admitting: Family Medicine

## 2019-02-16 ENCOUNTER — Other Ambulatory Visit: Payer: Self-pay

## 2019-02-16 DIAGNOSIS — G5701 Lesion of sciatic nerve, right lower limb: Secondary | ICD-10-CM | POA: Diagnosis not present

## 2019-02-16 NOTE — Assessment & Plan Note (Signed)
Symptoms seem to be consistent with piriformis syndrome.  She may also have a component of greater trochanteric pain syndrome.  Has a history of desmoid tumor but that was isolated pain in the back. -Counseled on home exercise therapy and supportive care. -Counseled on exercise and avoiding sitting in front of the computer for long periods of time. -If no improvement can consider x-ray, physical therapy or injection.

## 2019-02-16 NOTE — Progress Notes (Signed)
[name] [name] - 39 y.o. female MRN [id number]  Date of birth: August 06, 1980  SUBJECTIVE:  Including CC & ROS.  Chief Complaint  Patient presents with  . Hip Pain    right hip    [name] [name] is a 38 y.o. female that is presenting with right hip and leg pain.  The pain starts in the gluteus and radiates down the lateral aspect of her leg.  She has had this happen once or twice a year over the past 3 years.  Usually the pain resolves in a few days.  This time it lasted a bit longer.  She notices the pain when she sits or drives for periods of time.  Denies any numbness or tingling.  Pain is more achy currently.  She is able to live with it.  She plays lots of tennis and initially the pain improved.  Lately the pain seems to also occur over the lateral hip itself.  Denies any specific inciting event.   Review of Systems  Constitutional: Negative for fever.  HENT: Negative for congestion.   Respiratory: Negative for cough.   Cardiovascular: Negative for chest pain.  Gastrointestinal: Negative for abdominal pain.  Musculoskeletal: Negative for joint swelling.  Skin: Negative for color change.  Neurological: Negative for weakness.  Hematological: Negative for adenopathy.    HISTORY: Past Medical, Surgical, Social, and Family History Reviewed & Updated per EMR.   Pertinent Historical Findings include:  Past Medical History:  Diagnosis Date  . Clotting disorder ([place])   . Desmoid tumor   . Factor 5 Leiden mutation, heterozygous [facility])     Past Surgical History:  Procedure Laterality Date  . desmoid tumor removal  12/2001 and 04/2002    Allergies  Allergen Reactions  . Morphine And Related Itching  . Meperidine Itching    No family history on file.   Social History   Socioeconomic History  . Marital status: Married    Spouse name: Not on file  . Number of children: Not on file  . Years of education: Not on file  . Highest education level: Not on file  Occupational History  .  Not on file  Social Needs  . Financial resource strain: Not on file  . Food insecurity    Worry: Not on file    Inability: Not on file  . Transportation needs    Medical: Not on file    Non-medical: Not on file  Tobacco Use  . Smoking status: Never Smoker  . Smokeless tobacco: Never Used  Substance and Sexual Activity  . Alcohol use: No  . Drug use: No  . Sexual activity: Not on file  Lifestyle  . Physical activity    Days per week: Not on file    Minutes per session: Not on file  . Stress: Not on file  Relationships  . Social Herbalist on phone: Not on file    Gets together: Not on file    Attends religious service: Not on file    Active member of club or organization: Not on file    Attends meetings of clubs or organizations: Not on file    Relationship status: Not on file  . Intimate partner violence    Fear of current or ex partner: Not on file    Emotionally abused: Not on file    Physically abused: Not on file    Forced sexual activity: Not on file  Other Topics Concern  . Not  on file  Social History Narrative  . Not on file     PHYSICAL EXAM:  VS: BP 107/74   Pulse 80   Ht 5\' 2"  (1.575 m)   Wt 140 lb (63.5 kg)   BMI 25.61 kg/m  Physical Exam Gen: NAD, alert, cooperative with exam, well-appearing ENT: normal lips, normal nasal mucosa,  Eye: normal EOM, normal conjunctiva and lids CV:  no edema, +2 pedal pulses   Resp: no accessory muscle use, non-labored,   Skin: no rashes, no areas of induration  Neuro: normal tone, normal sensation to touch Psych:  normal insight, alert and oriented MSK:  Right hip/Back: No tenderness to palpation of the greater trochanter or SI joint. No tenderness over the piriformis. Normal internal and external rotation.  Greater internal rotation on the right compared to the left. Normal strength resistance with one leg standing and hip flexion. Normal strength resistance with knee flexion extension,  plantarflexion and dorsiflexion. Negative straight leg raise. Some weakness with hip abduction and tensor fascia lata testing. Stronger with hip abduction and gluteus medius and minimus. Normal FADIR and FABER.  NVI        ASSESSMENT & PLAN:   Piriformis syndrome Symptoms seem to be consistent with piriformis syndrome.  She may also have a component of greater trochanteric pain syndrome.  Has a history of desmoid tumor but that was isolated pain in the back. -Counseled on home exercise therapy and supportive care. -Counseled on exercise and avoiding sitting in front of the computer for long periods of time. -If no improvement can consider x-ray, physical therapy or injection.

## 2019-02-16 NOTE — Patient Instructions (Signed)
Nice to meet you Please try the exercises and stretches  Please try to avoid sitting for long periods of time  Please try ibuprofen as needed   Please send me a message in MyChart with any questions or updates.  Please see me back in 4-6 weeks.   --Dr. Raeford Razor

## 2019-03-16 ENCOUNTER — Ambulatory Visit: Payer: 59 | Admitting: Family Medicine

## 2019-06-21 ENCOUNTER — Other Ambulatory Visit: Payer: Self-pay | Admitting: Family Medicine

## 2019-06-21 DIAGNOSIS — Z1231 Encounter for screening mammogram for malignant neoplasm of breast: Secondary | ICD-10-CM

## 2019-06-26 ENCOUNTER — Ambulatory Visit: Payer: 59 | Attending: Internal Medicine

## 2019-06-26 DIAGNOSIS — Z20822 Contact with and (suspected) exposure to covid-19: Secondary | ICD-10-CM

## 2019-06-27 LAB — NOVEL CORONAVIRUS, NAA: SARS-CoV-2, NAA: NOT DETECTED

## 2019-07-31 ENCOUNTER — Other Ambulatory Visit: Payer: Self-pay

## 2019-07-31 ENCOUNTER — Ambulatory Visit
Admission: RE | Admit: 2019-07-31 | Discharge: 2019-07-31 | Disposition: A | Discharge status: Home / Self Care | Payer: 59 | Source: Ambulatory Visit | Attending: Family Medicine | Admitting: Family Medicine

## 2019-07-31 DIAGNOSIS — Z1231 Encounter for screening mammogram for malignant neoplasm of breast: Secondary | ICD-10-CM

## 2020-05-23 ENCOUNTER — Encounter: Payer: Self-pay | Admitting: Family Medicine

## 2020-05-23 ENCOUNTER — Ambulatory Visit (INDEPENDENT_AMBULATORY_CARE_PROVIDER_SITE_OTHER): Payer: 59 | Admitting: Family Medicine

## 2020-05-23 ENCOUNTER — Other Ambulatory Visit: Payer: Self-pay

## 2020-05-23 VITALS — BP 105/73 | HR 66 | Temp 97.9°F | Ht 63.0 in | Wt 135.2 lb

## 2020-05-23 DIAGNOSIS — D6851 Activated protein C resistance: Secondary | ICD-10-CM | POA: Insufficient documentation

## 2020-05-23 DIAGNOSIS — Z114 Encounter for screening for human immunodeficiency virus [HIV]: Secondary | ICD-10-CM | POA: Diagnosis not present

## 2020-05-23 DIAGNOSIS — Z23 Encounter for immunization: Secondary | ICD-10-CM

## 2020-05-23 DIAGNOSIS — Z1159 Encounter for screening for other viral diseases: Secondary | ICD-10-CM | POA: Diagnosis not present

## 2020-05-23 DIAGNOSIS — Z0000 Encounter for general adult medical examination without abnormal findings: Secondary | ICD-10-CM | POA: Diagnosis not present

## 2020-05-23 LAB — LIPID PANEL
Cholesterol: 140 mg/dL (ref 0–200)
HDL: 73.3 mg/dL (ref >39.00)
LDL Cholesterol: 55 mg/dL (ref 0–99)
NonHDL: 67.14
Total CHOL/HDL Ratio: 2
Triglycerides: 60 mg/dL (ref 0.0–149.0)
VLDL: 12 mg/dL (ref 0.0–40.0)

## 2020-05-23 LAB — COMPREHENSIVE METABOLIC PANEL
ALT: 13 U/L (ref 0–35)
AST: 15 U/L (ref 0–37)
Albumin: 4.6 g/dL (ref 3.5–5.2)
Alkaline Phosphatase: 32 U/L — ABNORMAL LOW (ref 39–117)
BUN: 16 mg/dL (ref 6–23)
CO2: 27 mEq/L (ref 19–32)
Calcium: 9.4 mg/dL (ref 8.4–10.5)
Chloride: 105 mEq/L (ref 96–112)
Creatinine, Ser: 0.8 mg/dL (ref 0.40–1.20)
GFR: 91.17 mL/min (ref >60.00)
Glucose, Bld: 93 mg/dL (ref 70–99)
Potassium: 4.3 mEq/L (ref 3.5–5.1)
Sodium: 138 mEq/L (ref 135–145)
Total Bilirubin: 1 mg/dL (ref 0.2–1.2)
Total Protein: 6.7 g/dL (ref 6.0–8.3)

## 2020-05-23 LAB — CBC WITH DIFFERENTIAL/PLATELET
Basophils Absolute: 0 10*3/uL (ref 0.0–0.1)
Basophils Relative: 0.4 % (ref 0.0–3.0)
Eosinophils Absolute: 0 10*3/uL (ref 0.0–0.7)
Eosinophils Relative: 1 % (ref 0.0–5.0)
HCT: 44.2 % (ref 36.0–46.0)
Hemoglobin: 15.1 g/dL — ABNORMAL HIGH (ref 12.0–15.0)
Lymphocytes Relative: 28.1 % (ref 12.0–46.0)
Lymphs Abs: 1.2 10*3/uL (ref 0.7–4.0)
MCHC: 34.2 g/dL (ref 30.0–36.0)
MCV: 91.5 fl (ref 78.0–100.0)
Monocytes Absolute: 0.4 10*3/uL (ref 0.1–1.0)
Monocytes Relative: 9.7 % (ref 3.0–12.0)
Neutro Abs: 2.6 10*3/uL (ref 1.4–7.7)
Neutrophils Relative %: 60.8 % (ref 43.0–77.0)
Platelets: 158 10*3/uL (ref 150.0–400.0)
RBC: 4.83 Mil/uL (ref 3.87–5.11)
RDW: 12.9 % (ref 11.5–15.5)
WBC: 4.3 10*3/uL (ref 4.0–10.5)

## 2020-05-23 LAB — TSH: TSH: 0.78 u[IU]/mL (ref 0.35–4.50)

## 2020-05-23 NOTE — Patient Instructions (Addendum)
So nice to meet you! Happy new year!  Dr. Rogers Blocker   Preventive Care 42-42 Years Old, Female Preventive care refers to visits with your health care provider and lifestyle choices that can promote health and wellness. This includes:  A yearly physical exam. This may also be called an annual well check.  Regular dental visits and eye exams.  Immunizations.  Screening for certain conditions.  Healthy lifestyle choices, such as eating a healthy diet, getting regular exercise, not using drugs or products that contain nicotine and tobacco, and limiting alcohol use. What can I expect for my preventive care visit? Physical exam Your health care provider will check your:  Height and weight. This may be used to calculate body mass index (BMI), which tells if you are at a healthy weight.  Heart rate and blood pressure.  Skin for abnormal spots. Counseling Your health care provider may ask you questions about your:  Alcohol, tobacco, and drug use.  Emotional well-being.  Home and relationship well-being.  Sexual activity.  Eating habits.  Work and work Statistician.  Method of birth control.  Menstrual cycle.  Pregnancy history. What immunizations do I need?  Influenza (flu) vaccine  This is recommended every year. Tetanus, diphtheria, and pertussis (Tdap) vaccine  You may need a Td booster every 10 years. Varicella (chickenpox) vaccine  You may need this if you have not been vaccinated. Human papillomavirus (HPV) vaccine  If recommended by your health care provider, you may need three doses over 6 months. Measles, mumps, and rubella (MMR) vaccine  You may need at least one dose of MMR. You may also need a second dose. Meningococcal conjugate (MenACWY) vaccine  One dose is recommended if you are age 42-42 years and a first-year college student living in a residence hall, or if you have one of several medical conditions. You may also need additional booster  doses. Pneumococcal conjugate (PCV13) vaccine  You may need this if you have certain conditions and were not previously vaccinated. Pneumococcal polysaccharide (PPSV23) vaccine  You may need one or two doses if you smoke cigarettes or if you have certain conditions. Hepatitis A vaccine  You may need this if you have certain conditions or if you travel or work in places where you may be exposed to hepatitis A. Hepatitis B vaccine  You may need this if you have certain conditions or if you travel or work in places where you may be exposed to hepatitis B. Haemophilus influenzae type b (Hib) vaccine  You may need this if you have certain conditions. You may receive vaccines as individual doses or as more than one vaccine together in one shot (combination vaccines). Talk with your health care provider about the risks and benefits of combination vaccines. What tests do I need?  Blood tests  Lipid and cholesterol levels. These may be checked every 5 years starting at age 42.  Hepatitis C test.  Hepatitis B test. Screening  Diabetes screening. This is done by checking your blood sugar (glucose) after you have not eaten for a while (fasting).  Sexually transmitted disease (STD) testing.  BRCA-related cancer screening. This may be done if you have a family history of breast, ovarian, tubal, or peritoneal cancers.  Pelvic exam and Pap test. This may be done every 3 years starting at age 42. Starting at age 15, this may be done every 5 years if you have a Pap test in combination with an HPV test. Talk with your health care provider about  your test results, treatment options, and if necessary, the need for more tests. Follow these instructions at home: Eating and drinking   Eat a diet that includes fresh fruits and vegetables, whole grains, lean protein, and low-fat dairy.  Take vitamin and mineral supplements as recommended by your health care provider.  Do not drink alcohol  if: ? Your health care provider tells you not to drink. ? You are pregnant, may be pregnant, or are planning to become pregnant.  If you drink alcohol: ? Limit how much you have to 0-1 drink a day. ? Be aware of how much alcohol is in your drink. In the [place] one drink equals one 12 oz bottle of beer (355 mL), one 5 oz glass of wine (148 mL), or one 1 oz glass of hard liquor (44 mL). Lifestyle  Take daily care of your teeth and gums.  Stay active. Exercise for at least 30 minutes on 5 or more days each week.  Do not use any products that contain nicotine or tobacco, such as cigarettes, e-cigarettes, and chewing tobacco. If you need help quitting, ask your health care provider.  If you are sexually active, practice safe sex. Use a condom or other form of birth control (contraception) in order to prevent pregnancy and STIs (sexually transmitted infections). If you plan to become pregnant, see your health care provider for a preconception visit. What's next?  Visit your health care provider once a year for a well check visit.  Ask your health care provider how often you should have your eyes and teeth checked.  Stay up to date on all vaccines. This information is not intended to replace advice given to you by your health care provider. Make sure you discuss any questions you have with your health care provider. Document Revised: 01/13/2018 Document Reviewed: 01/13/2018 Elsevier Patient Education  2020 [employer].

## 2020-05-23 NOTE — Progress Notes (Signed)
Patient: [name] [name] MRN: [id number] DOB: 1981/04/18 PCP: Orma Flaming, MD     Subjective:  Chief Complaint  Patient presents with  . Annual Exam  . Establish Care    HPI: The patient is a 40 y.o. female who presents today for annual exam. She denies any changes to past medical history. There have been no recent hospitalizations. They are following a well balanced diet and exercise plan. He plays tennis several times weekly, and walks. Weight has been increasing steadily. No complaints today. She is requesting a flu shot.   No family hx of breast or colon cancer.   Immunization History  Administered Date(s) Administered  . Influenza,inj,Quad PF,6+ Mos 05/23/2020  . Influenza-Unspecified 05/05/2019  . Tdap 07/16/2012   Colonoscopy: routine screening  Mammogram: 07/31/2019 Pap smear: she thinks within last 5 years.    Review of Systems  Constitutional: Negative for chills, fatigue and fever.  HENT: Negative for dental problem, ear pain, hearing loss and trouble swallowing.   Eyes: Negative for visual disturbance.  Respiratory: Negative for cough, chest tightness and shortness of breath.   Cardiovascular: Negative for chest pain, palpitations and leg swelling.  Gastrointestinal: Negative for abdominal pain, blood in stool, diarrhea and nausea.  Endocrine: Negative for cold intolerance, polydipsia, polyphagia and polyuria.  Genitourinary: Negative for dysuria, frequency, hematuria and urgency.  Musculoskeletal: Negative for arthralgias.  Skin: Negative for rash.  Neurological: Negative for dizziness and headaches.  Psychiatric/Behavioral: Negative for dysphoric mood and sleep disturbance. The patient is not nervous/anxious.     Allergies Patient is allergic to morphine and related and meperidine.  Past Medical History Patient  has a past medical history of Clotting disorder ([place]), Desmoid tumor, and Factor 5 Leiden mutation, heterozygous ([place]).  Surgical  History Patient  has a past surgical history that includes desmoid tumor removal (12/2001 and 04/2002).  Family History Pateint's family history is not on file.  Social History Patient  reports that she has never smoked. She has never used smokeless tobacco. She reports that she does not drink alcohol and does not use drugs.    Objective: Vitals:   05/23/20 0826  BP: 105/73  Pulse: 66  Temp: 97.9 F (36.6 C)  TempSrc: Temporal  SpO2: 100%  Weight: 135 lb 3.2 oz (61.3 kg)  Height: 5\' 3"  (1.6 m)    Body mass index is 23.95 kg/m.  Physical Exam Vitals reviewed.  Constitutional:      Appearance: Normal appearance. She is well-developed, normal weight and well-nourished.  HENT:     Head: Normocephalic and atraumatic.     Right Ear: Tympanic membrane, ear canal and external ear normal.     Left Ear: Tympanic membrane, ear canal and external ear normal.     Nose: Nose normal.     Mouth/Throat:     Mouth: Oropharynx is clear and moist. Mucous membranes are moist.  Eyes:     Extraocular Movements: Extraocular movements intact and EOM normal.     Conjunctiva/sclera: Conjunctivae normal.     Pupils: Pupils are equal, round, and reactive to light.  Neck:     Thyroid: No thyromegaly.  Cardiovascular:     Rate and Rhythm: Normal rate and regular rhythm.     Pulses: Normal pulses and intact distal pulses.     Heart sounds: Normal heart sounds. No murmur heard.   Pulmonary:     Effort: Pulmonary effort is normal.     Breath sounds: Normal breath sounds.  Abdominal:  General: Abdomen is flat. Bowel sounds are normal. There is no distension.     Palpations: Abdomen is soft.     Tenderness: There is no abdominal tenderness.  Musculoskeletal:     Cervical back: Normal range of motion and neck supple.  Lymphadenopathy:     Cervical: No cervical adenopathy.  Skin:    General: Skin is warm and dry.     Capillary Refill: Capillary refill takes less than 2 seconds.      Findings: No rash.  Neurological:     General: No focal deficit present.     Mental Status: She is alert and oriented to person, place, and time.     Cranial Nerves: No cranial nerve deficit.     Coordination: Coordination normal.     Deep Tendon Reflexes: Reflexes normal.  Psychiatric:        Mood and Affect: Mood and affect and mood normal.        Behavior: Behavior normal.    Flowsheet Row Office Visit from 05/23/2020 in [facility]  PHQ-2 Total Score 0         Assessment/plan: 1. Annual physical exam Routine labs today. Had a shake this AM. Fit and healty. HM reviewed. Requesting records to get her pap smear. Otherwise utd. Flu shot today. Fu in one year or as needed.  Patient counseling [x]    Nutrition: Stressed importance of moderation in sodium/caffeine intake, saturated fat and cholesterol, caloric balance, sufficient intake of fresh fruits, vegetables, fiber, calcium, iron, and 1 mg of folate supplement per day (for females capable of pregnancy).  [x]    Stressed the importance of regular exercise.   []    Substance Abuse: Discussed cessation/primary prevention of tobacco, alcohol, or other drug use; driving or other dangerous activities under the influence; availability of treatment for abuse.   [x]    Injury prevention: Discussed safety belts, safety helmets, smoke detector, smoking near bedding or upholstery.   [x]    Sexuality: Discussed sexually transmitted diseases, partner selection, use of condoms, avoidance of unintended pregnancy  and contraceptive alternatives.  [x]    Dental health: Discussed importance of regular tooth brushing, flossing, and dental visits.  [x]    Health maintenance and immunizations reviewed. Please refer to Health maintenance section.    - CBC with Differential/Platelet - Comprehensive metabolic panel - Lipid panel - TSH  2. Factor V Leiden (HCC) Discussed no need for anticoagulation unless having surgeries.   3. Need  for immunization against influenza  - Flu Vaccine QUAD 36+ mos IM  4. Encounter for screening for HIV  - HIV Antibody (routine testing w rflx)  5. Encounter for hepatitis C screening test for low risk patient  - Hepatitis C antibody    This visit occurred during the SARS-CoV-2 public health emergency.  Safety protocols were in place, including screening questions prior to the visit, additional usage of staff PPE, and extensive cleaning of exam room while observing appropriate contact time as indicated for disinfecting solutions.     Return in about 1 year (around 05/23/2021) for annual .     , MD [facility]  05/23/2020

## 2020-05-24 LAB — HIV ANTIBODY (ROUTINE TESTING W REFLEX): HIV 1&2 Ab, 4th Generation: NONREACTIVE

## 2020-05-24 LAB — HEPATITIS C ANTIBODY
Hepatitis C Ab: NONREACTIVE
SIGNAL TO CUT-OFF: 0 (ref <1.00)

## 2021-05-26 ENCOUNTER — Encounter: Payer: 59 | Admitting: Family Medicine

## 2021-11-26 IMAGING — MG DIGITAL SCREENING BILAT W/ TOMO W/ CAD
8 series · 9 of 24 positions shown · non-contrast
Comparison: Previous exam(s).

CLINICAL DATA: Screening.

EXAM:
DIGITAL SCREENING BILATERAL MAMMOGRAM WITH TOMO AND CAD

[L MLO synth-2D]
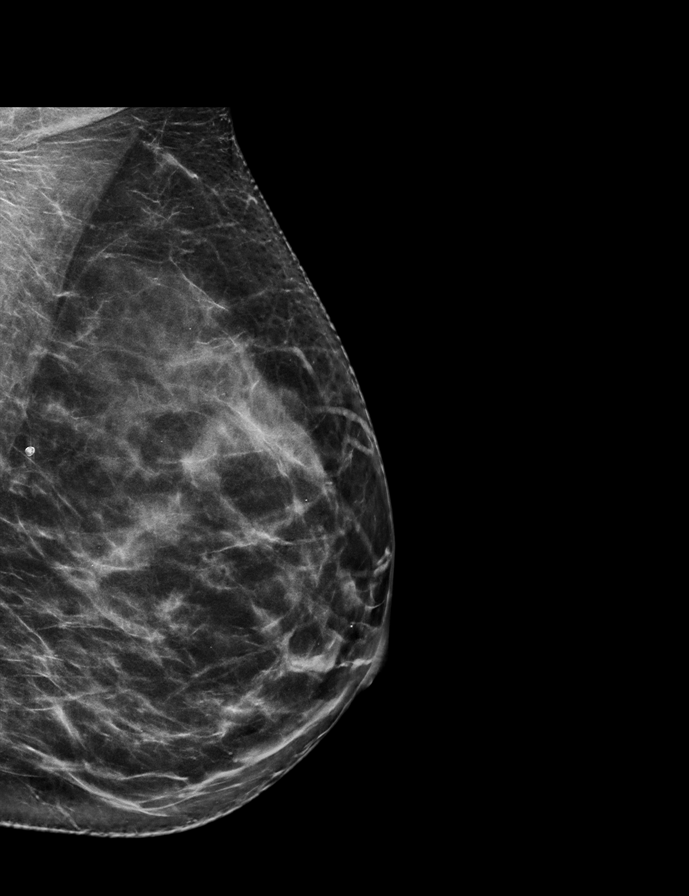

[R MLO synth-2D]
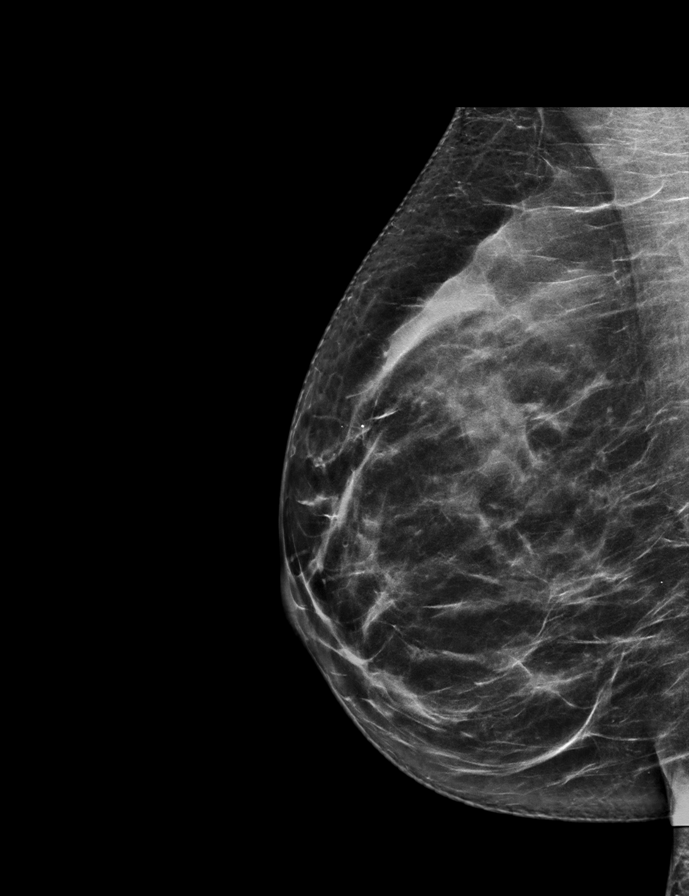

[L CC synth-2D]
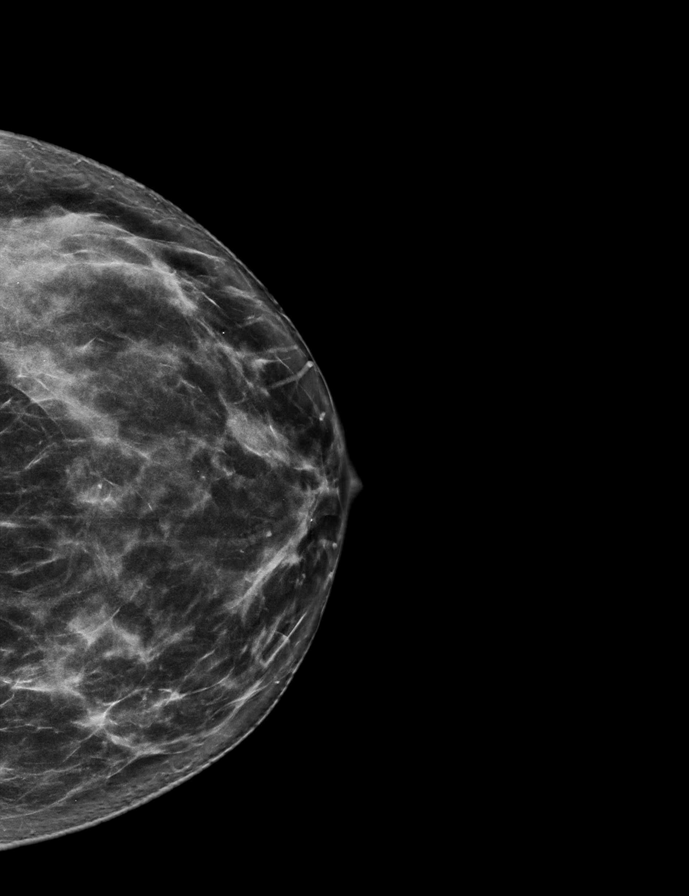

[R CC synth-2D]
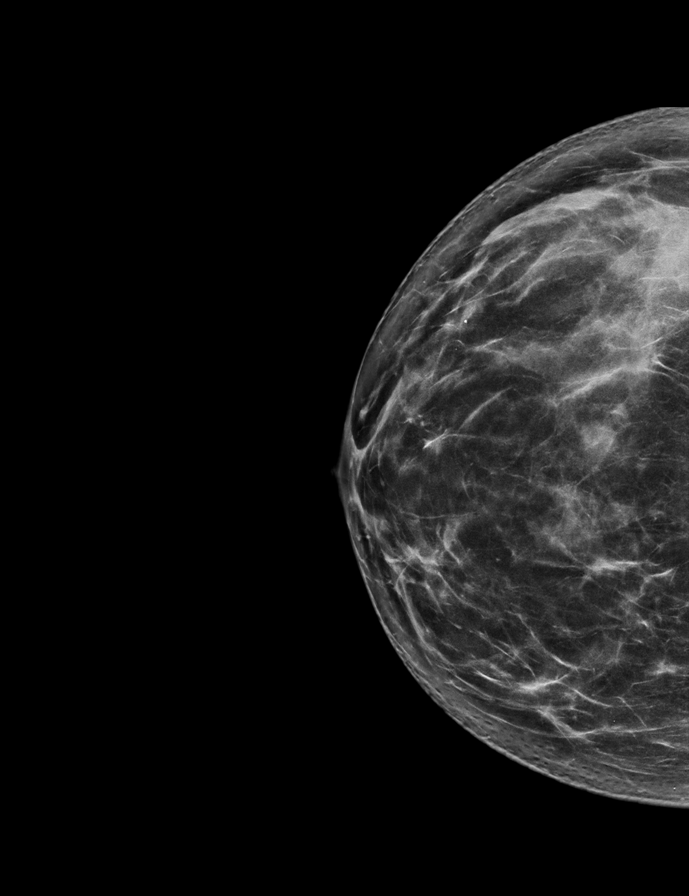

[R CC tomo · 2 of 68 frames shown]
[frame 22/68]
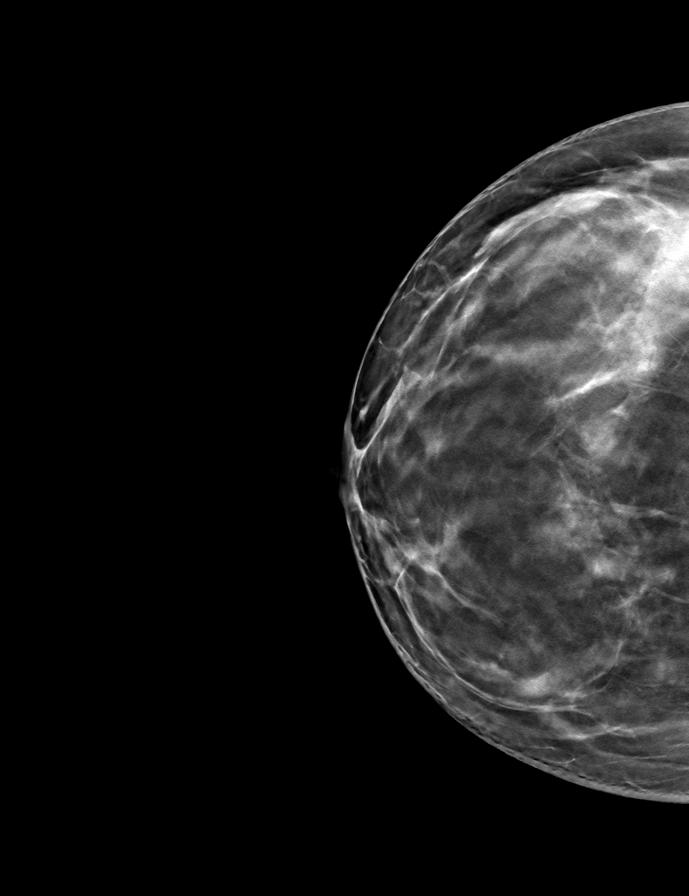
[frame 35/68]
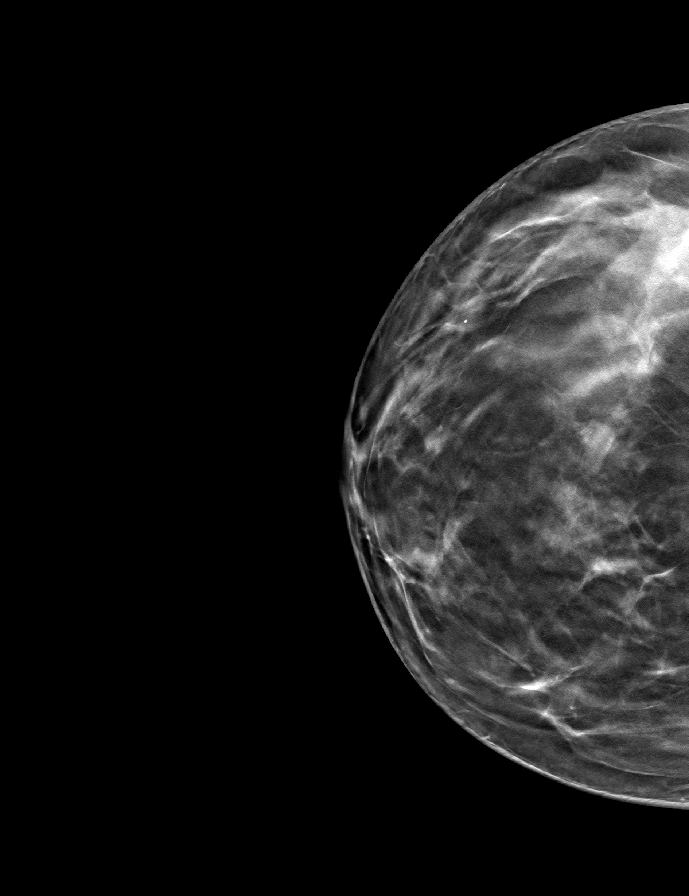

[L CC tomo · tomo slice 33/66.0]
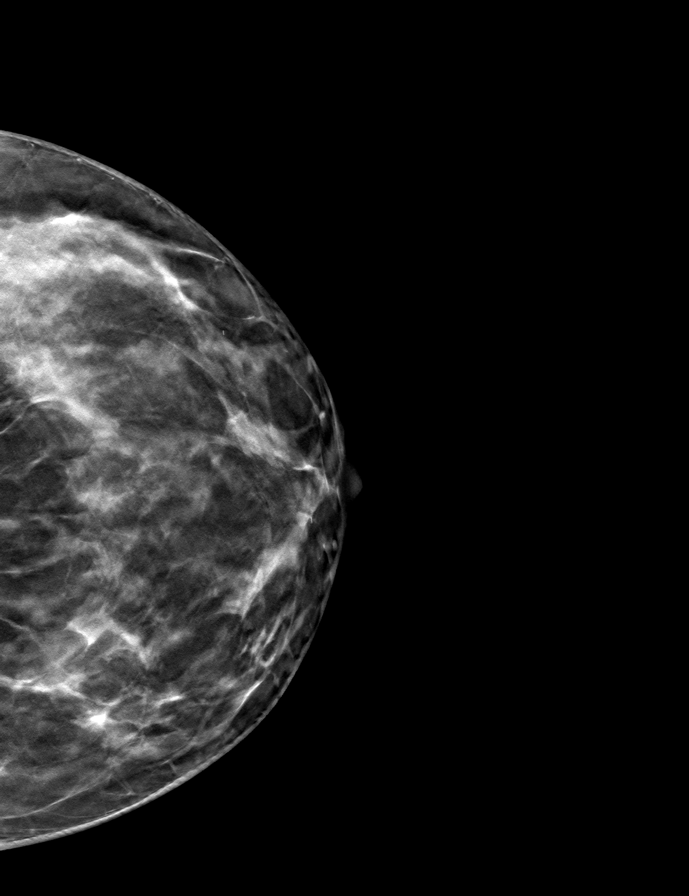

[R MLO tomo · tomo slice 35/69.0]
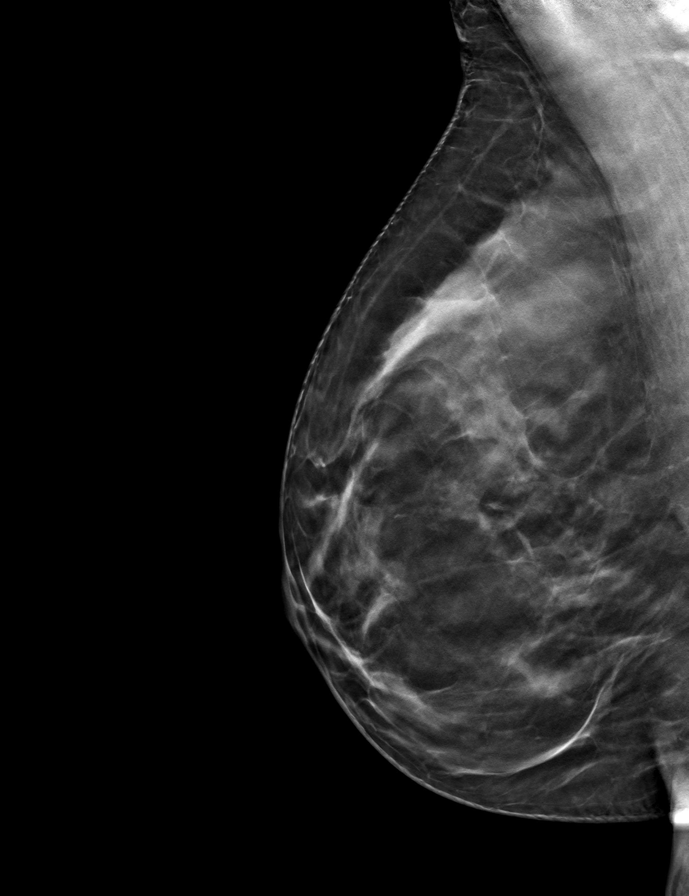

[L MLO tomo · tomo slice 36/71.0]
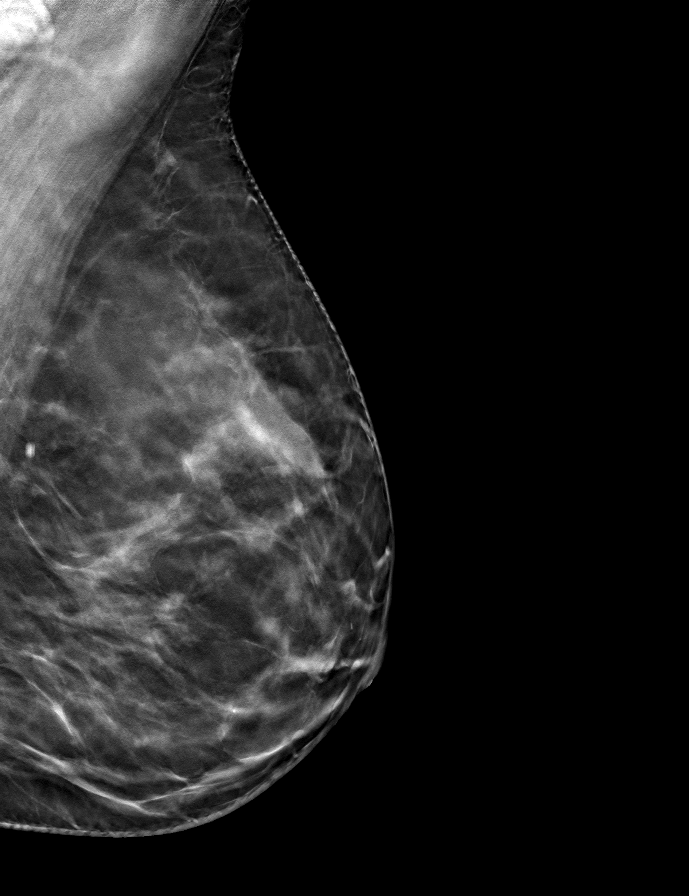

[9 of 24 positions shown; findings below may reference images not displayed]

ACR Breast Density Category c: The breast tissue is heterogeneously
dense, which may obscure small masses.
FINDINGS: There are no findings suspicious for malignancy. Images were
processed with CAD.
IMPRESSION: No mammographic evidence of malignancy. A result letter of this
screening mammogram will be mailed directly to the patient.

RECOMMENDATION:
Screening mammogram in one year. (Code:[id number])

BI-RADS CATEGORY  1: Negative.

## 2022-01-21 ENCOUNTER — Other Ambulatory Visit: Payer: Self-pay | Admitting: Family Medicine

## 2022-01-21 DIAGNOSIS — M7918 Myalgia, other site: Secondary | ICD-10-CM

## 2022-01-22 ENCOUNTER — Other Ambulatory Visit (HOSPITAL_COMMUNITY): Payer: Self-pay | Admitting: Family Medicine

## 2022-01-22 DIAGNOSIS — M79604 Pain in right leg: Secondary | ICD-10-CM

## 2022-01-23 ENCOUNTER — Ambulatory Visit (HOSPITAL_COMMUNITY)
Admission: RE | Admit: 2022-01-23 | Discharge: 2022-01-23 | Disposition: A | Discharge status: Home / Self Care | Payer: 59 | Source: Ambulatory Visit | Attending: Family Medicine | Admitting: Family Medicine

## 2022-01-23 DIAGNOSIS — M79604 Pain in right leg: Secondary | ICD-10-CM | POA: Insufficient documentation

## 2022-01-23 DIAGNOSIS — M7989 Other specified soft tissue disorders: Secondary | ICD-10-CM | POA: Insufficient documentation

## 2022-01-23 NOTE — Progress Notes (Signed)
Lower extremity venous duplex has been completed.   Preliminary results in CV Proc.   Jinny Blossom Jamesa Tedrick 01/23/2022 10:11 AM

## 2022-03-07 ENCOUNTER — Other Ambulatory Visit: Payer: Self-pay | Admitting: Family Medicine

## 2022-03-07 DIAGNOSIS — Z1231 Encounter for screening mammogram for malignant neoplasm of breast: Secondary | ICD-10-CM

## 2022-03-19 ENCOUNTER — Ambulatory Visit
Admission: RE | Admit: 2022-03-19 | Discharge: 2022-03-19 | Disposition: A | Discharge status: Home / Self Care | Payer: Managed Care, Other (non HMO) | Source: Ambulatory Visit | Attending: Family Medicine | Admitting: Family Medicine

## 2022-03-19 DIAGNOSIS — Z1231 Encounter for screening mammogram for malignant neoplasm of breast: Secondary | ICD-10-CM

## 2022-03-24 ENCOUNTER — Other Ambulatory Visit: Payer: Self-pay | Admitting: Family Medicine

## 2022-03-24 DIAGNOSIS — R928 Other abnormal and inconclusive findings on diagnostic imaging of breast: Secondary | ICD-10-CM

## 2022-04-06 ENCOUNTER — Ambulatory Visit
Admission: RE | Admit: 2022-04-06 | Discharge: 2022-04-06 | Disposition: A | Discharge status: Home / Self Care | Payer: Managed Care, Other (non HMO) | Source: Ambulatory Visit | Attending: Family Medicine | Admitting: Family Medicine

## 2022-04-06 DIAGNOSIS — R928 Other abnormal and inconclusive findings on diagnostic imaging of breast: Secondary | ICD-10-CM

## 2022-04-07 ENCOUNTER — Other Ambulatory Visit: Payer: Managed Care, Other (non HMO)

## 2022-08-31 ENCOUNTER — Encounter: Payer: Self-pay | Admitting: *Deleted

## 2023-03-23 ENCOUNTER — Other Ambulatory Visit: Payer: Self-pay | Admitting: Family Medicine

## 2023-03-23 DIAGNOSIS — Z1231 Encounter for screening mammogram for malignant neoplasm of breast: Secondary | ICD-10-CM

## 2023-03-30 ENCOUNTER — Inpatient Hospital Stay
Admission: RE | Admit: 2023-03-30 | Discharge: 2023-03-30 | Discharge status: Home / Self Care | Payer: Managed Care, Other (non HMO) | Source: Ambulatory Visit | Attending: Family Medicine

## 2023-03-30 DIAGNOSIS — Z1231 Encounter for screening mammogram for malignant neoplasm of breast: Secondary | ICD-10-CM

## 2023-04-01 ENCOUNTER — Ambulatory Visit: Payer: Managed Care, Other (non HMO)

## 2024-04-07 ENCOUNTER — Other Ambulatory Visit: Payer: Self-pay | Admitting: Family Medicine

## 2024-04-07 DIAGNOSIS — Z1231 Encounter for screening mammogram for malignant neoplasm of breast: Secondary | ICD-10-CM

## 2024-05-09 ENCOUNTER — Ambulatory Visit
Admission: RE | Admit: 2024-05-09 | Discharge: 2024-05-09 | Disposition: A | Discharge status: Home / Self Care | Source: Ambulatory Visit | Attending: Family Medicine | Admitting: Family Medicine

## 2024-05-09 DIAGNOSIS — Z1231 Encounter for screening mammogram for malignant neoplasm of breast: Secondary | ICD-10-CM
